# Patient Record
Sex: Female | Born: 1950
Health system: Southern US, Community
[De-identification: ages and names within clinical notes are randomized; demographics above are authoritative.]

## PROBLEM LIST (undated history)

## (undated) DIAGNOSIS — I10 Essential (primary) hypertension: Secondary | ICD-10-CM

## (undated) DIAGNOSIS — E785 Hyperlipidemia, unspecified: Secondary | ICD-10-CM

## (undated) DIAGNOSIS — T783XXA Angioneurotic edema, initial encounter: Secondary | ICD-10-CM

## (undated) DIAGNOSIS — E669 Obesity, unspecified: Secondary | ICD-10-CM

## (undated) DIAGNOSIS — I341 Nonrheumatic mitral (valve) prolapse: Secondary | ICD-10-CM

## (undated) HISTORY — DX: Hyperlipidemia, unspecified: E78.5

## (undated) HISTORY — PX: ABDOMINAL HYSTERECTOMY: SHX81

## (undated) HISTORY — DX: Essential (primary) hypertension: I10

## (undated) HISTORY — PX: OTHER SURGICAL HISTORY: SHX169

## (undated) HISTORY — DX: Angioneurotic edema, initial encounter: T78.3XXA

## (undated) HISTORY — DX: Obesity, unspecified: E66.9

## (undated) HISTORY — DX: Nonrheumatic mitral (valve) prolapse: I34.1

---

## 1997-12-03 ENCOUNTER — Other Ambulatory Visit: Admission: RE | Admit: 1997-12-03 | Discharge: 1997-12-03 | Payer: Self-pay | Admitting: Gynecology

## 1998-12-16 ENCOUNTER — Other Ambulatory Visit: Admission: RE | Admit: 1998-12-16 | Discharge: 1998-12-16 | Payer: Self-pay | Admitting: Gynecology

## 2000-02-08 ENCOUNTER — Other Ambulatory Visit: Admission: RE | Admit: 2000-02-08 | Discharge: 2000-02-08 | Payer: Self-pay | Admitting: Gynecology

## 2000-05-03 ENCOUNTER — Encounter (INDEPENDENT_AMBULATORY_CARE_PROVIDER_SITE_OTHER): Payer: Self-pay | Admitting: Specialist

## 2000-05-03 ENCOUNTER — Other Ambulatory Visit: Admission: RE | Admit: 2000-05-03 | Discharge: 2000-05-03 | Payer: Self-pay | Admitting: Gynecology

## 2000-05-24 ENCOUNTER — Encounter: Payer: Self-pay | Admitting: Gynecology

## 2000-05-31 ENCOUNTER — Inpatient Hospital Stay (HOSPITAL_COMMUNITY): Admission: RE | Admit: 2000-05-31 | Discharge: 2000-06-01 | Payer: Self-pay | Admitting: Gynecology

## 2000-05-31 ENCOUNTER — Encounter (INDEPENDENT_AMBULATORY_CARE_PROVIDER_SITE_OTHER): Payer: Self-pay | Admitting: Specialist

## 2001-02-28 ENCOUNTER — Other Ambulatory Visit: Admission: RE | Admit: 2001-02-28 | Discharge: 2001-02-28 | Payer: Self-pay | Admitting: Gynecology

## 2002-03-11 ENCOUNTER — Other Ambulatory Visit: Admission: RE | Admit: 2002-03-11 | Discharge: 2002-03-11 | Payer: Self-pay | Admitting: Gynecology

## 2003-03-12 ENCOUNTER — Other Ambulatory Visit: Admission: RE | Admit: 2003-03-12 | Discharge: 2003-03-12 | Payer: Self-pay | Admitting: Gynecology

## 2003-05-19 ENCOUNTER — Ambulatory Visit (HOSPITAL_COMMUNITY): Admission: RE | Admit: 2003-05-19 | Discharge: 2003-05-20 | Payer: Self-pay | Admitting: Neurosurgery

## 2004-01-27 ENCOUNTER — Ambulatory Visit (HOSPITAL_COMMUNITY): Admission: RE | Admit: 2004-01-27 | Discharge: 2004-01-27 | Payer: Self-pay | Admitting: *Deleted

## 2004-03-14 ENCOUNTER — Other Ambulatory Visit: Admission: RE | Admit: 2004-03-14 | Discharge: 2004-03-14 | Payer: Self-pay | Admitting: Gynecology

## 2005-01-03 ENCOUNTER — Encounter: Admission: RE | Admit: 2005-01-03 | Discharge: 2005-01-03 | Payer: Self-pay | Admitting: Cardiology

## 2005-03-15 ENCOUNTER — Other Ambulatory Visit: Admission: RE | Admit: 2005-03-15 | Discharge: 2005-03-15 | Payer: Self-pay | Admitting: Gynecology

## 2006-03-20 ENCOUNTER — Other Ambulatory Visit: Admission: RE | Admit: 2006-03-20 | Discharge: 2006-03-20 | Payer: Self-pay | Admitting: Gynecology

## 2007-03-28 ENCOUNTER — Other Ambulatory Visit: Admission: RE | Admit: 2007-03-28 | Discharge: 2007-03-28 | Payer: Self-pay | Admitting: Gynecology

## 2008-01-20 IMAGING — US MAMMO-RUNI-US
1 series · 13 of 15 positions shown · non-contrast
Comparison: NONE

CLINICAL DATA: Naneishka Cianuro RT(R)(M)(CT)   
Diagnostic  Mammogram. 

RIGHT BREAST MAMMOGRAM ADDITIONAL VIEWS AND RIGHT BREAST 
ULTRASOUND

[Series 1: us breast · 0.08mm/px · 13 of 15 slices shown]
[im 1/15]
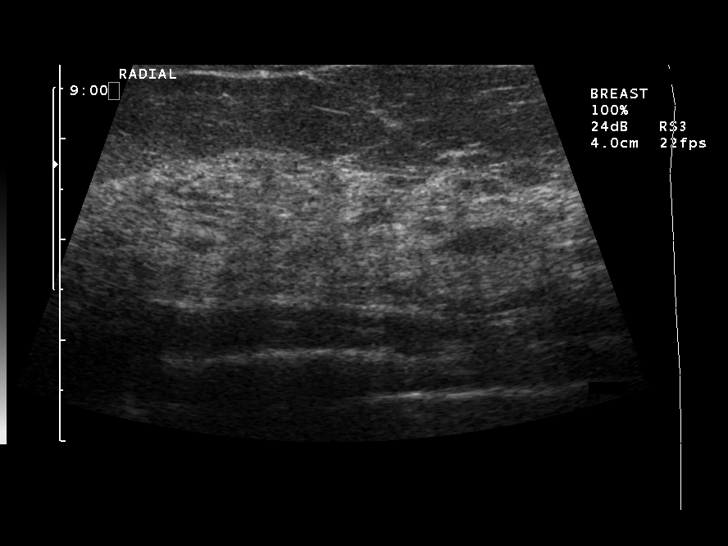
[im 2/15]
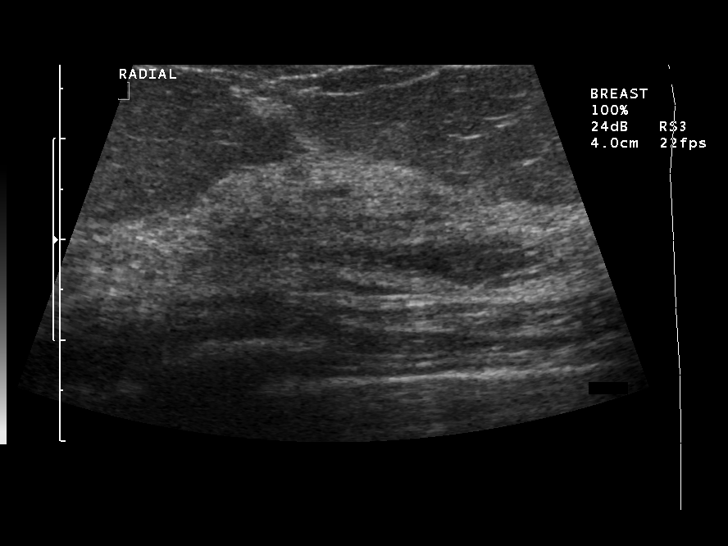
[im 3/15]
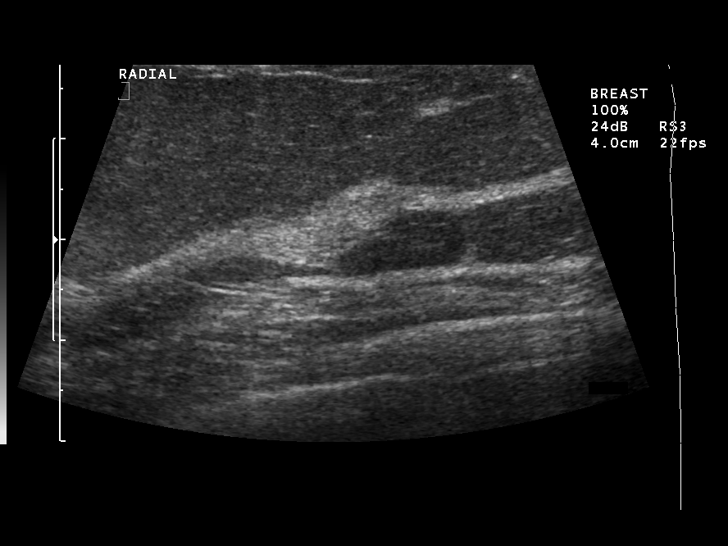
[im 5/15]
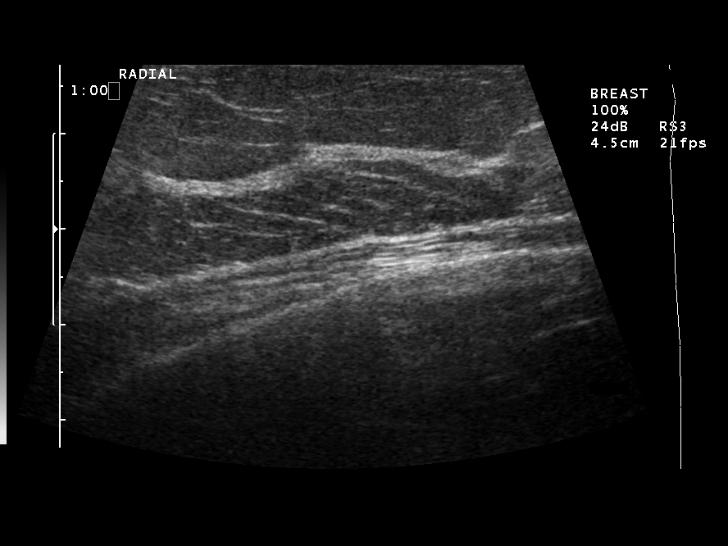
[im 6/15]
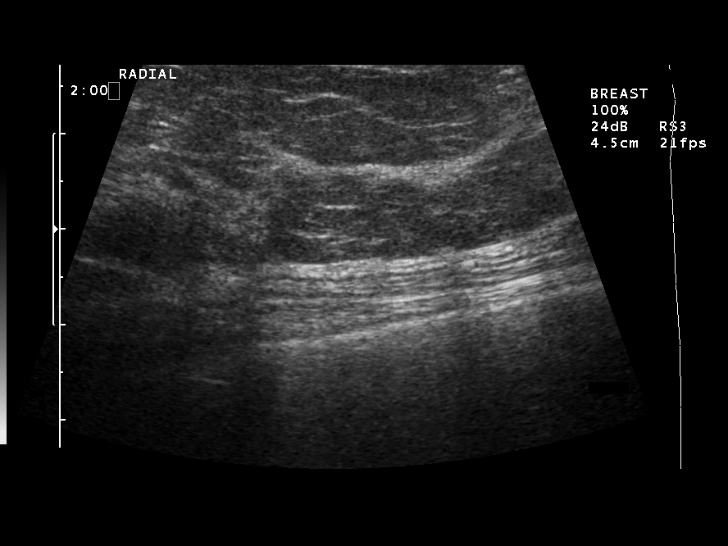
[im 7/15]
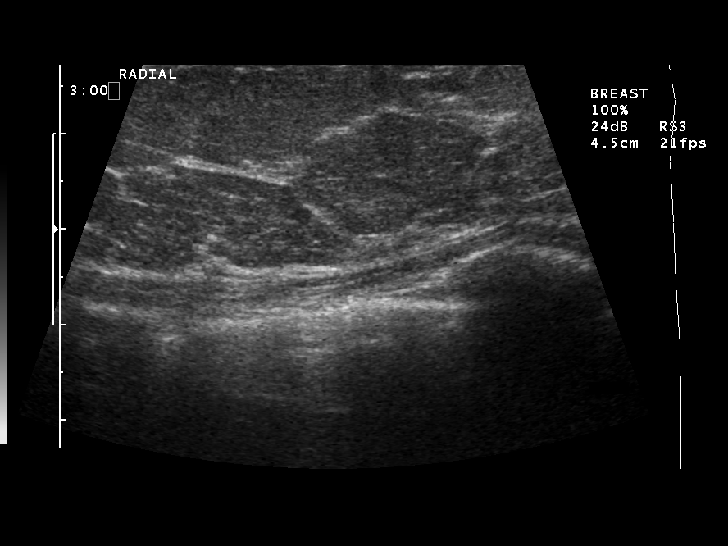
[im 8/15]
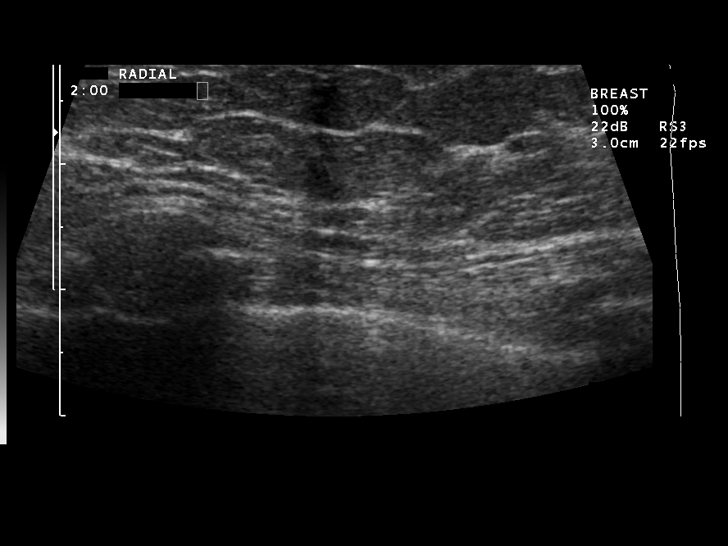
[im 9/15]
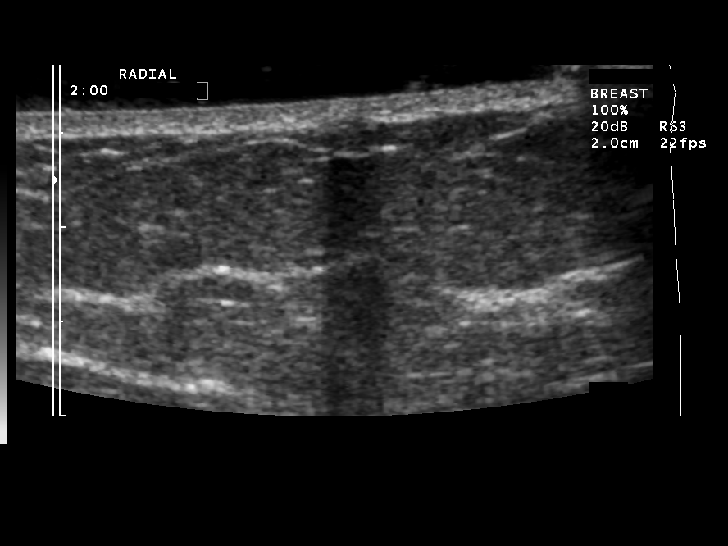
[im 10/15]
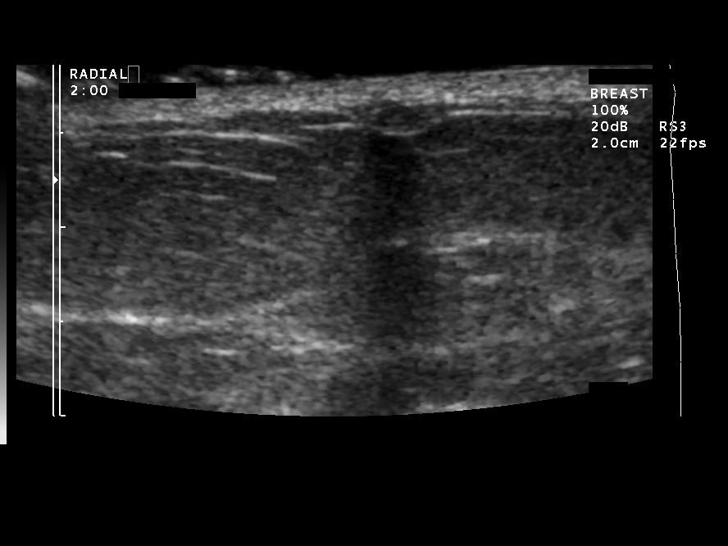
[im 11/15]
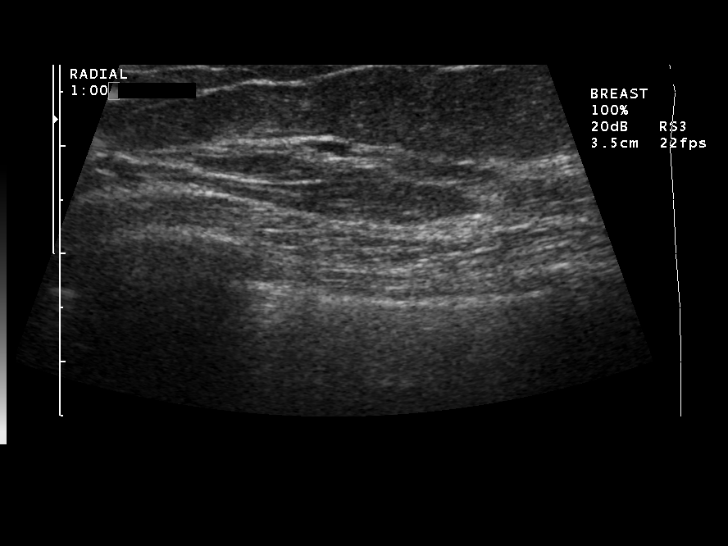
[im 13/15]
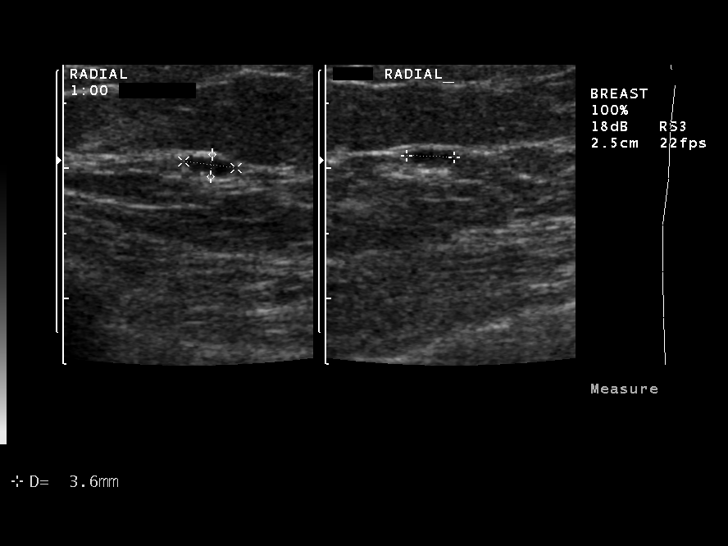
[im 14/15]
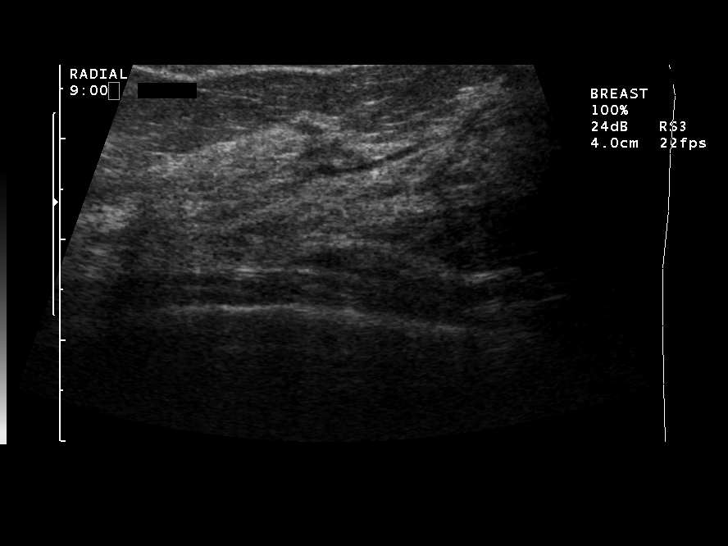
[im 15/15]
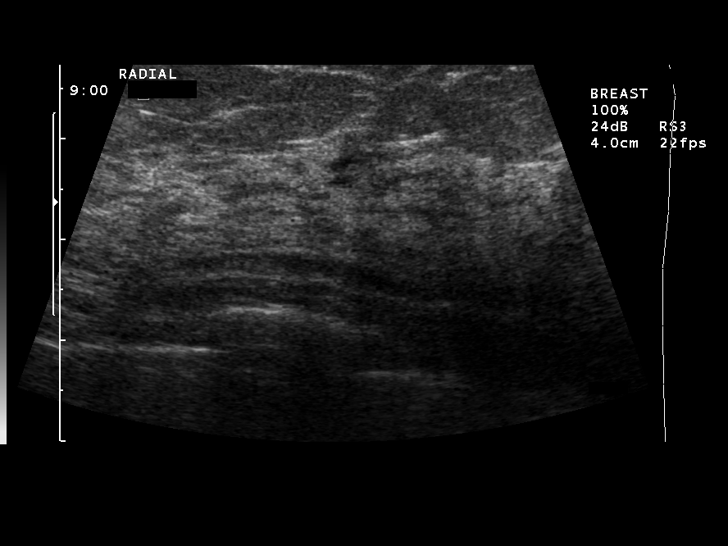

[13 of 15 positions shown; findings below may reference images not displayed]

FINDINGS: There is moderately dense breast parenchyma.  Spot 
compression view is obtained in the upper half of the right 
breast.  Previously suggested architectural distortion is no 
longer seen.  A repeat MLO view was also obtained.  There is no 
evidence of architectural distortion on the repeat view.  An 
exaggerated lateral CC view was obtained.  There is no evidence of 
architectural distortion.  A CC view was obtained.  There is no 
evidence of architectural distortion seen in this area.  There is 
prior mammogram dated 05-08-05 and 05-05-04 for comparison. 
Ultrasound is performed of the upper half of the right breast.  
There are no dominant solid masses seen.  There is a small cyst 
seen at the [DATE] position, 10 cm from the nipple that measures 4 x 
4 x 2 mm.  There is evidence of shadowing that appears to be 
associated with the skin but no mass was visualized.  There is no 
abnormal shadowing seen within the breast tissue.
IMPRESSION: No mammographic or ultrasonographic evidence of 
malignancy. I recommend routine yearly follow-up screening 
mammography. A negative mammogram and ultrasound should not delay 
further evaluation of any clinically suspicious lesion. The 
patient was informed at the time of the examination of the 
findings and recommendations by verbal and written lay report. 
Computer assisted (Second Look) technology was used as an aid in 
interpretation of this study. BI-RADS 2- Benign Rauni Lavander, M.D. 
Date: 05/23/2006 JH  KAKI

## 2010-06-24 NOTE — Op Note (Signed)
NAME:  Brooke Yoder, Brooke Yoder                  ACCOUNT NO.:  [id number]   MEDICAL RECORD NO.:  [id number]                   PATIENT TYPE:  OIB   LOCATION:  2899                                 FACILITY:  [facility]   PHYSICIAN:  Clydene Fake, M.D.               DATE OF BIRTH:  12/23/1950   DATE OF PROCEDURE:  05/19/2003  DATE OF DISCHARGE:                                 OPERATIVE REPORT   PREOPERATIVE DIAGNOSIS:  Herniated nucleus pulposus, right C6-7.   POSTOPERATIVE DIAGNOSIS:  Herniated nucleus pulposus, right C6-7.   OPERATION PERFORMED:  Anterior cervical decompression, diskectomy and fusion  at C6-7 with LifeNet allograft bone and tether anterior cervical plate.   SURGEON:  Clydene Fake, M.D.   ASSISTANT:  Hilda Lias, M.D.   ANESTHESIA:  General endotracheal tube.   ESTIMATED BLOOD LOSS:  Minimal.   BLOOD REPLACED:  None.   DRAINS:  None.   COMPLICATIONS:  None.   INDICATIONS FOR PROCEDURE:  The patient is a 60 year old woman who has been  having neck and right arm pain, numbness and weakness.  MRI shows right-  sided disk herniation at C6-7 compressing the C7 root.  She does have  decreased triceps strength, atrophy of that muscle, decreased sensation of  the C7 distribution on exam.  Patient brought in for decompression and  fusion.   DESCRIPTION OF PROCEDURE:  The patient was brought to the operating room and  general anesthesia induced.  Patient was placed in 10 pounds of halter  traction, prepped and draped in sterile fashion.  Site of incision was  injected with 10 mL of 1% lidocaine with epinephrine.  Incision was then  made from the midline to the anterior border of the sternocleidomastoid  muscle on the left side and that incision taken down to the platysma and  hemostasis obtained with Bovie cautery.  The Bovie was used to incise the  platysma and blunt dissection was taken through the anterior cervical fascia  of the anterior spine.  Needle was  placed in the interspace.  X-ray was  obtained showing this was in the C6-7 disk space.  The disk space was  incised with a 15 blade.  The needle was removed and partial diskectomy was  performed.  The longus colli muscles were reflected laterally on each side  using the Bovie and self-retaining retractor system was placed.  The disk  space was again incised with a 15 blade and diskectomy performed with  pituitary rongeurs and curets.  Microscope was brought in for  microdissection and 1 and 2 mm Kerrison punches were used to remove  posterior ligament osteophytes and then to decompress the central canal and  then perform bilateral foraminotomies.  Fragments of disk were removed from  the right foramen. When we were finished, we had good decompression of the  central canal and bilateral foramina and bilateral nerve roots were  decompressed.  Hemostasis obtained with Gelfoam and Thrombin.  This was then  removed.  High speed drill was used to remove cartilaginous end plate. We  then measured the height of the disk space.  Using a LifeNet trowel 6 mm  fit, we roughed up the end plates using the 6 mm broach and then tapped a 6  mm LifeNet allograft bone into place, countersinking it about 1 mm.  We  checked posterior to the bone graft and there was plenty of room between  dura and bone graft with a nerve hook.  Wound was irrigated with antibiotic  solution.  Prior to doing the diskectomy, distraction pins were placed in  the C6 and 7 and the interspace distracted. The distraction was removed.  Distraction pins were removed.  Hemostasis obtained with Gelfoam and  Thrombin.  The weight was removed from the traction.  Bone was still in good  position and firmly in place.  The microscope was removed from the field.  The anterior cervical plate was placed over the anterior cervical spine, two  screws placed in C6, two into C7.  These were tightened down.  Lateral x-ray  was obtained showing good  position of plate and screws and interbody bone  graft with good orientation of the spine. Wound was irrigated with  antibiotic solution.  Hemostasis was obtained with bipolar cautery, Gelfoam  and Thrombin.  Gelfoam was irrigated.  When we had good hemostasis the  platysma was closed with 3-0 Vicryl interrupted suture.  The subcutaneous  tissue was closed with the same and skin closed with benzoin and Steri-  Strips.  Dressing was placed.  Patient was placed in ____________ awakened  from anesthesia and transferred to recovery room in stable condition.                                               Clydene Fake, M.D.    JRH/MEDQ  D:  05/19/2003  T:  05/20/2003  Job:  [id number]

## 2010-06-24 NOTE — H&P (Signed)
[facility]  Patient:    Brooke Yoder, Brooke Yoder               MRN: [id number] Adm. Date:  [id number] Attending:  Teodora Medici Cabitt                         History and Physical  ADMITTING DIAGNOSIS:  Fibroid uterus.  HISTORY OF PRESENT ILLNESS:  The patient is a 60 year old gravida 2, para 2, female admitted with fibroid uterus and menorrhagia and anemia for a total abdominal hysterectomy and bilateral salpingo-oophorectomy.  The patient has longstanding menorrhagia, which has been unresponsive to oral contraceptives. Platelet count, PT, and PTT and TSH have been normal, and saline ultrasound was negative.  The patient does have a fibroid uterus.  The patients hemoglobin was 9.9 on April 24, 2000, and options were discussed with the patient, including uterine fibroid embolization and hysterectomy.  Endometrial biopsy was performed, which revealed benign secretory endometrium.  The patient has elected to proceed with hysterectomy.  The potential risks of the surgery, including but not limited to anesthesia, injury to the bowel, bladder or ureters, possible fistula formation, possible blood loss with transfusion and its sequelae, and possible infection in the wound or the pelvis, have been discussed in detail with the patient.  I also had the opportunity to discuss the patients case and management options with a physician representing the insurance company, who also concurred with the decision to proceed with hysterectomy.  Postoperative expectations and restrictions have been reviewed with the patient.  PAST MEDICAL HISTORY:  Surgical:  None.  Medical:  None.  MEDICATIONS:  None.  ALLERGIES:  None known.  SOCIAL HISTORY:  Smoking:  None.  ETOH:  Social.  The patient is married, in a supportive relationship.  FAMILY HISTORY:  Positive for cancer of the breast in a second cousin.  PHYSICAL EXAMINATION:  HEENT:  Negative.  CHEST:  The lungs are  clear.  CARDIAC:  The heart is without murmurs.  BREASTS:  Without masses or discharge.  ABDOMEN:  Soft and nontender.  PELVIC:  BUS, vagina and cervix normal.  The uterus is eight weeks in size and irregular.  The adnexa without palpable masses.  RECTAL:  Negative.  EXTREMITIES:  Negative.  IMPRESSION:  A fibroid uterus, menorrhagia, anemia.  PLAN:  Total abdominal hysterectomy and bilateral salpingo-oophorectomy. DD:  05/31/00 TD:  05/31/00 Job: [id number]

## 2010-06-24 NOTE — Op Note (Signed)
[facility]  Patient:    Brooke Yoder, Brooke Yoder               MRN: [id number] Proc. Date: 05/31/00 Adm. Date:  [id number] Disc. Date: [id number] Attending:  Teodora Medici Cabitt                           Operative Report  PREOPERATIVE DIAGNOSIS:  Fibroid uterus, menorrhagia, and anemia.  POSTOPERATIVE DIAGNOSIS:  Fibroid uterus, menorrhagia, and anemia.  OPERATION PERFORMED:  Total abdominal hysterectomy and bilateral salpingo-oophorectomy.  SURGEON:  Leatha Gilding. Mezer, M.D.  ASSISTANT:  Harl Bowie, M.D.  ANESTHESIA:  Endotracheal.  PREPARATION:  Betadine.  PROCEDURE:  With the patient in the supine position, she was prepped and draped in the routine fashion. A Pfannenstiel incision was made through the skin and subcutaneous tissue.  The fascia and peritoneum were opened without difficulty.  Upon entering the peritoneal cavity, it was noted that the retractor could not be fully opened on the left side, secondary to adhesions of the sigmoid colon to left round ligament and infundibulopelvic ligament. The retractor was opened partially, the bowel packed back, and these adhesions taken down in multiple layers.  The adhesions were quite dense and were very close to the main vasculature of the infundibulopelvic ligament.  These adhesions were very carefully taken down and attention paid to hemostasis. After the operative site was restored, the upper abdomen was briefly examined and found to be grossly normal.  The uterus was approximately eight weeks in size and irregular with a prominent subserosal leiomyoma.  The left tube and ovary were normal.  The right ovary contained what appeared to be a corpus luteum cyst.  The round ligaments were suture ligated with #1 chromic and divided.  The anterior leaf of the broad ligament was opened bilaterally and the bladder flap taken down sharply and bluntly without difficulty.  The infundibulopelvic ligaments  were isolated, clamped, cut, and free tied with #1 chromic and then suture ligated with #1 chromic.  The uterine arteries were clamped, cut, and suture ligated with #1 chromic.  The cardinal ligaments were taken in several bites, clamped, cut, and suture ligated with #1 chromic. There was a significant amount of back bleeding throughout the entire dissection.  The uterosacral ligaments were taken separately, clamped, cut, and suture ligated with #1 chromic.  The vagina was entered laterally on the left and the specimen excised with circumferential dissection.  The cervix was intact.  The angles were closed with ______ type angle sutures of #1 chromic, and the cuff was whipped anteriorly and posteriorly with running locked #1 chromic suture.  Hemostasis was assured at the cuff and the base of the bladder, and two posterior-anterior stitches of #1 chromic were placed to decrease the vaginal opening. The bladder was then placed over the vaginal cuff with a running 3-0 Vicryl suture.  At the completion of the procedure, the pedicles were inspected.  Hemostasis was noted to be intact.  The ureters were inspected and found to be out of harms way.  The adhesions that had been previously lysed were inspected.  One small bleeding area was noted, which was cauterized.  At the completion of the procedure with hemostasis intact, an effort was made to place the large bowel in the cul-de-sac.  The omentum was brought down.  The abdomen was closed in layers using a running 2-0 Vicryl on the peritoneum and running 0 Vicryl to the  midline bilaterally on the fascia. Hemostasis was secured in the subcutaneous tissue, and the skin was closed with staples.  The estimated blood loss was 350 cc.  The sponge, instrument, and needle counts were correct x2.  The patient tolerated the procedure well and was taken to the recovery room in satisfactory condition. DD:  05/31/00 TD:  06/01/00 Job: [id number]

## 2010-07-12 ENCOUNTER — Encounter: Payer: Self-pay | Admitting: Cardiology

## 2010-07-12 ENCOUNTER — Other Ambulatory Visit: Payer: Self-pay | Admitting: *Deleted

## 2010-07-12 DIAGNOSIS — E785 Hyperlipidemia, unspecified: Secondary | ICD-10-CM

## 2010-07-13 ENCOUNTER — Encounter: Payer: Self-pay | Admitting: Cardiology

## 2010-07-13 ENCOUNTER — Ambulatory Visit (INDEPENDENT_AMBULATORY_CARE_PROVIDER_SITE_OTHER): Payer: 59 | Admitting: Cardiology

## 2010-07-13 ENCOUNTER — Other Ambulatory Visit (INDEPENDENT_AMBULATORY_CARE_PROVIDER_SITE_OTHER): Payer: 59 | Admitting: *Deleted

## 2010-07-13 DIAGNOSIS — Z8249 Family history of ischemic heart disease and other diseases of the circulatory system: Secondary | ICD-10-CM

## 2010-07-13 DIAGNOSIS — I10 Essential (primary) hypertension: Secondary | ICD-10-CM

## 2010-07-13 DIAGNOSIS — E785 Hyperlipidemia, unspecified: Secondary | ICD-10-CM

## 2010-07-13 DIAGNOSIS — E7800 Pure hypercholesterolemia, unspecified: Secondary | ICD-10-CM | POA: Insufficient documentation

## 2010-07-13 LAB — HEPATIC FUNCTION PANEL
ALT: 26 U/L (ref 0–35)
AST: 28 U/L (ref 0–37)
Albumin: 4.3 g/dL (ref 3.5–5.2)
Alkaline Phosphatase: 96 U/L (ref 39–117)
Bilirubin, Direct: 0.1 mg/dL (ref 0.0–0.3)
Total Bilirubin: 0.4 mg/dL (ref 0.3–1.2)
Total Protein: 7.9 g/dL (ref 6.0–8.3)

## 2010-07-13 LAB — BASIC METABOLIC PANEL
BUN: 18 mg/dL (ref 6–23)
CO2: 28 mEq/L (ref 19–32)
Calcium: 9.9 mg/dL (ref 8.4–10.5)
Chloride: 103 mEq/L (ref 96–112)
Creatinine, Ser: 0.8 mg/dL (ref 0.4–1.2)
GFR: 80.33 mL/min (ref >60.00)
Glucose, Bld: 110 mg/dL — ABNORMAL HIGH (ref 70–99)
Potassium: 4.1 mEq/L (ref 3.5–5.1)
Sodium: 139 mEq/L (ref 135–145)

## 2010-07-13 LAB — LIPID PANEL
Cholesterol: 189 mg/dL (ref 0–200)
HDL: 82 mg/dL (ref >39.00)
LDL Cholesterol: 86 mg/dL (ref 0–99)
Total CHOL/HDL Ratio: 2
Triglycerides: 103 mg/dL (ref 0.0–149.0)
VLDL: 20.6 mg/dL (ref 0.0–40.0)

## 2010-07-13 NOTE — Assessment & Plan Note (Signed)
Lab work is pending today 

## 2010-07-13 NOTE — Assessment & Plan Note (Signed)
We'll continue current medicines. I am aware  that amlodipine causes her mild edema.I think her blood pressure will improve with weight loss. She's intolerant of ACE inhibitors because of angioedema and hopefully she can accomplish most of this adequate blood pressure control with weight loss.

## 2010-07-13 NOTE — Progress Notes (Signed)
Subjective:   Brooke Yoder is seen today for followup of her hypertension, hyperlipemia, and mild obesity. She's complaining of some mild edema with amlodipine that she's intolerant of ACE inhibitors because of angioedema. She's currently doing Weight Watchers and trying to lose weight again but just started approximately one week ago. Her blood pressure slightly elevated today but has been satisfactory.  Current Outpatient Prescriptions  Medication Sig Dispense Refill  . amLODipine (NORVASC) 5 MG tablet Take 5 mg by mouth daily.        Marland Kitchen aspirin 81 MG tablet Take 81 mg by mouth daily.        . Calcium Carbonate (CALTRATE 600 PO) Take by mouth 2 (two) times daily.        . Cetirizine HCl (ZYRTEC PO) Take by mouth as needed.        . fish oil-omega-3 fatty acids 1000 MG capsule Take by mouth daily.        . hydrochlorothiazide 25 MG tablet Take 25 mg by mouth daily.        . metoprolol (TOPROL-XL) 50 MG 24 hr tablet Take 50 mg by mouth daily.        . Multiple Vitamin (MULTIVITAMIN) tablet Take 1 tablet by mouth daily.        . rosuvastatin (CRESTOR) 5 MG tablet Take 5 mg by mouth daily.        Marland Kitchen DISCONTD: estrogens conjugated, synthetic A, (CENESTIN) 0.3 MG tablet Take 0.3 mg by mouth daily.          Allergies  Allergen Reactions  . Lisinopril   . Percocet (Oxycodone-Acetaminophen)     There is no problem list on file for this patient.   History  Smoking status  . Former Smoker  . Quit date: 02/07/1984  Smokeless tobacco  . Not on file    History  Alcohol Use No    Family History  Problem Relation Age of Onset  . Heart attack Father   . Hypertension Father   . Arthritis Mother   . Hypertension Sister   . Hypertension Sister     Review of Systems:   The patient denies any heat or cold intolerance.  No weight gain or weight loss.  The patient denies headaches or blurry vision.  There is no cough or sputum production.  The patient denies dizziness.  There is no hematuria or  hematochezia.  The patient denies any muscle aches or arthritis.  The patient denies any rash.  The patient denies frequent falling or instability.  There is no history of depression or anxiety.  All other systems were reviewed and are negative.   Physical Exam:   Weight is 118. Blood pressure is 152/70 sitting heart rate 72.The head is normocephalic and atraumatic.  Pupils are equally round and reactive to light.  Sclerae nonicteric.  Conjunctiva is clear.  Oropharynx is unremarkable.  There's adequate oral airway.  Neck is supple there are no masses.  Thyroid is not enlarged.  There is no lymphadenopathy.  Lungs are clear.  Chest is symmetric.  Heart shows a regular rate and rhythm.  S1 and S2 are normal.  There is no murmur click or gallop.  Abdomen is soft normal bowel sounds.  There is no organomegaly.  Genital and rectal deferred.  Extremities are without edema.  Peripheral pulses are adequate.  Neurologically intact.  Full range of motion.  The patient is not depressed.  Skin is warm and dry.  Assessment / Plan:

## 2010-07-18 NOTE — Progress Notes (Signed)
Patient called with lab results. Pt verbalized understanding. Jodette Ronald Londo RN  

## 2010-11-15 ENCOUNTER — Other Ambulatory Visit: Payer: Self-pay | Admitting: Nurse Practitioner

## 2010-11-29 ENCOUNTER — Other Ambulatory Visit: Payer: Self-pay | Admitting: Nurse Practitioner

## 2010-11-29 NOTE — Telephone Encounter (Addendum)
Pt calling re rx for crestor, pharmacy said we  called in for 90 days, 65 pills, instead, of 90 days and 90 pills, pls call

## 2010-11-29 NOTE — Telephone Encounter (Signed)
CALLED BECAUSE MAIL ORDER CHARGED HER EXTRA MONEY FOR 65 PILLS, WHEN WE PRESCRIBED IT FOR 90 DAY SUPPLY. SHE WANTED TO VERIFY THAT WE PRESCRIBED IT FOR 90 DAY SUPPLY.

## 2015-05-17 ENCOUNTER — Other Ambulatory Visit: Payer: Self-pay | Admitting: Obstetrics & Gynecology

## 2015-05-17 DIAGNOSIS — R922 Inconclusive mammogram: Secondary | ICD-10-CM

## 2015-05-20 ENCOUNTER — Other Ambulatory Visit: Payer: Self-pay

## 2015-05-24 ENCOUNTER — Ambulatory Visit
Admission: RE | Admit: 2015-05-24 | Discharge: 2015-05-24 | Disposition: A | Discharge status: Home / Self Care | Payer: BLUE CROSS/BLUE SHIELD | Source: Ambulatory Visit | Attending: Obstetrics & Gynecology | Admitting: Obstetrics & Gynecology

## 2015-05-24 DIAGNOSIS — R922 Inconclusive mammogram: Secondary | ICD-10-CM

## 2016-06-21 DIAGNOSIS — T63441A Toxic effect of venom of bees, accidental (unintentional), initial encounter: Secondary | ICD-10-CM | POA: Diagnosis not present

## 2016-06-21 DIAGNOSIS — L7211 Pilar cyst: Secondary | ICD-10-CM | POA: Diagnosis not present

## 2016-06-21 DIAGNOSIS — L509 Urticaria, unspecified: Secondary | ICD-10-CM | POA: Diagnosis not present

## 2016-07-19 DIAGNOSIS — M25561 Pain in right knee: Secondary | ICD-10-CM | POA: Diagnosis not present

## 2016-07-20 DIAGNOSIS — L7211 Pilar cyst: Secondary | ICD-10-CM | POA: Diagnosis not present

## 2016-07-20 DIAGNOSIS — L720 Epidermal cyst: Secondary | ICD-10-CM | POA: Diagnosis not present

## 2016-07-21 DIAGNOSIS — Z124 Encounter for screening for malignant neoplasm of cervix: Secondary | ICD-10-CM | POA: Diagnosis not present

## 2016-07-21 DIAGNOSIS — Z6826 Body mass index (BMI) 26.0-26.9, adult: Secondary | ICD-10-CM | POA: Diagnosis not present

## 2016-07-31 DIAGNOSIS — Z4802 Encounter for removal of sutures: Secondary | ICD-10-CM | POA: Diagnosis not present

## 2016-08-14 DIAGNOSIS — E785 Hyperlipidemia, unspecified: Secondary | ICD-10-CM | POA: Diagnosis not present

## 2016-08-14 DIAGNOSIS — I10 Essential (primary) hypertension: Secondary | ICD-10-CM | POA: Diagnosis not present

## 2016-08-14 DIAGNOSIS — E039 Hypothyroidism, unspecified: Secondary | ICD-10-CM | POA: Diagnosis not present

## 2016-08-16 DIAGNOSIS — Z23 Encounter for immunization: Secondary | ICD-10-CM | POA: Diagnosis not present

## 2016-08-16 DIAGNOSIS — Z0000 Encounter for general adult medical examination without abnormal findings: Secondary | ICD-10-CM | POA: Diagnosis not present

## 2016-08-16 DIAGNOSIS — I10 Essential (primary) hypertension: Secondary | ICD-10-CM | POA: Diagnosis not present

## 2016-08-16 DIAGNOSIS — M25561 Pain in right knee: Secondary | ICD-10-CM | POA: Diagnosis not present

## 2016-08-29 DIAGNOSIS — M25561 Pain in right knee: Secondary | ICD-10-CM | POA: Diagnosis not present

## 2016-08-31 DIAGNOSIS — M25561 Pain in right knee: Secondary | ICD-10-CM | POA: Diagnosis not present

## 2016-09-26 DIAGNOSIS — Z1231 Encounter for screening mammogram for malignant neoplasm of breast: Secondary | ICD-10-CM | POA: Diagnosis not present

## 2016-09-28 DIAGNOSIS — Y999 Unspecified external cause status: Secondary | ICD-10-CM | POA: Diagnosis not present

## 2016-09-28 DIAGNOSIS — M2241 Chondromalacia patellae, right knee: Secondary | ICD-10-CM | POA: Diagnosis not present

## 2016-09-28 DIAGNOSIS — S83231A Complex tear of medial meniscus, current injury, right knee, initial encounter: Secondary | ICD-10-CM | POA: Diagnosis not present

## 2016-09-28 DIAGNOSIS — G8918 Other acute postprocedural pain: Secondary | ICD-10-CM | POA: Diagnosis not present

## 2016-10-05 DIAGNOSIS — H524 Presbyopia: Secondary | ICD-10-CM | POA: Diagnosis not present

## 2016-10-05 DIAGNOSIS — H2513 Age-related nuclear cataract, bilateral: Secondary | ICD-10-CM | POA: Diagnosis not present

## 2016-10-06 DIAGNOSIS — M25561 Pain in right knee: Secondary | ICD-10-CM | POA: Diagnosis not present

## 2016-10-06 DIAGNOSIS — S83231D Complex tear of medial meniscus, current injury, right knee, subsequent encounter: Secondary | ICD-10-CM | POA: Diagnosis not present

## 2016-10-06 DIAGNOSIS — Z9889 Other specified postprocedural states: Secondary | ICD-10-CM | POA: Diagnosis not present

## 2016-10-06 DIAGNOSIS — M2241 Chondromalacia patellae, right knee: Secondary | ICD-10-CM | POA: Diagnosis not present

## 2016-10-12 DIAGNOSIS — S83231D Complex tear of medial meniscus, current injury, right knee, subsequent encounter: Secondary | ICD-10-CM | POA: Diagnosis not present

## 2016-10-12 DIAGNOSIS — M25561 Pain in right knee: Secondary | ICD-10-CM | POA: Diagnosis not present

## 2016-10-12 DIAGNOSIS — M2241 Chondromalacia patellae, right knee: Secondary | ICD-10-CM | POA: Diagnosis not present

## 2016-10-17 DIAGNOSIS — S83231D Complex tear of medial meniscus, current injury, right knee, subsequent encounter: Secondary | ICD-10-CM | POA: Diagnosis not present

## 2016-10-17 DIAGNOSIS — M25561 Pain in right knee: Secondary | ICD-10-CM | POA: Diagnosis not present

## 2016-10-17 DIAGNOSIS — M2241 Chondromalacia patellae, right knee: Secondary | ICD-10-CM | POA: Diagnosis not present

## 2016-10-19 DIAGNOSIS — M2241 Chondromalacia patellae, right knee: Secondary | ICD-10-CM | POA: Diagnosis not present

## 2016-10-19 DIAGNOSIS — S83231D Complex tear of medial meniscus, current injury, right knee, subsequent encounter: Secondary | ICD-10-CM | POA: Diagnosis not present

## 2016-10-19 DIAGNOSIS — M25561 Pain in right knee: Secondary | ICD-10-CM | POA: Diagnosis not present

## 2016-10-23 DIAGNOSIS — M25561 Pain in right knee: Secondary | ICD-10-CM | POA: Diagnosis not present

## 2016-10-23 DIAGNOSIS — S83231D Complex tear of medial meniscus, current injury, right knee, subsequent encounter: Secondary | ICD-10-CM | POA: Diagnosis not present

## 2016-10-23 DIAGNOSIS — M2241 Chondromalacia patellae, right knee: Secondary | ICD-10-CM | POA: Diagnosis not present

## 2016-10-25 DIAGNOSIS — M25561 Pain in right knee: Secondary | ICD-10-CM | POA: Diagnosis not present

## 2016-10-25 DIAGNOSIS — S83231D Complex tear of medial meniscus, current injury, right knee, subsequent encounter: Secondary | ICD-10-CM | POA: Diagnosis not present

## 2016-10-25 DIAGNOSIS — M2241 Chondromalacia patellae, right knee: Secondary | ICD-10-CM | POA: Diagnosis not present

## 2016-11-04 DIAGNOSIS — Z23 Encounter for immunization: Secondary | ICD-10-CM | POA: Diagnosis not present

## 2016-11-29 DIAGNOSIS — Z9889 Other specified postprocedural states: Secondary | ICD-10-CM | POA: Diagnosis not present

## 2016-12-13 ENCOUNTER — Other Ambulatory Visit (HOSPITAL_COMMUNITY): Payer: Self-pay | Admitting: Orthopaedic Surgery

## 2016-12-13 DIAGNOSIS — M7989 Other specified soft tissue disorders: Principal | ICD-10-CM

## 2016-12-13 DIAGNOSIS — Z9889 Other specified postprocedural states: Secondary | ICD-10-CM | POA: Diagnosis not present

## 2016-12-13 DIAGNOSIS — M79604 Pain in right leg: Secondary | ICD-10-CM

## 2016-12-14 ENCOUNTER — Ambulatory Visit (HOSPITAL_COMMUNITY)
Admission: RE | Admit: 2016-12-14 | Discharge: 2016-12-14 | Disposition: A | Discharge status: Home / Self Care | Payer: Medicare Other | Source: Ambulatory Visit | Attending: Orthopaedic Surgery | Admitting: Orthopaedic Surgery

## 2016-12-14 DIAGNOSIS — M7989 Other specified soft tissue disorders: Secondary | ICD-10-CM

## 2016-12-14 DIAGNOSIS — M79604 Pain in right leg: Secondary | ICD-10-CM | POA: Diagnosis not present

## 2016-12-14 DIAGNOSIS — M25561 Pain in right knee: Secondary | ICD-10-CM | POA: Insufficient documentation

## 2016-12-14 NOTE — Progress Notes (Signed)
Right lower extremity venous duplex has been completed. Negative for DVT. Results were given to [facility] at Dr. Erin Sons office.  12/14/16 9:13 AM Carlos Levering RVT

## 2016-12-27 DIAGNOSIS — M722 Plantar fascial fibromatosis: Secondary | ICD-10-CM | POA: Diagnosis not present

## 2017-01-21 IMAGING — US US BREAST 10
1 series · 6 of 6 positions shown · non-contrast
Comparison: Previous exam(s).

CLINICAL DATA: Palpable lump lower inner quadrant left breast.
Patient states that she believes the palpable area may be a
sebaceous cyst. She has had several sebaceous cysts on different
parts of the body in the past.

EXAM:
DIGITAL DIAGNOSTIC LEFT MAMMOGRAM WITH CAD
ULTRASOUND LEFT BREAST

[Series 1: us breast 10 · 0.05mm/px · 6 of 6 slices shown]
[im 1/6]
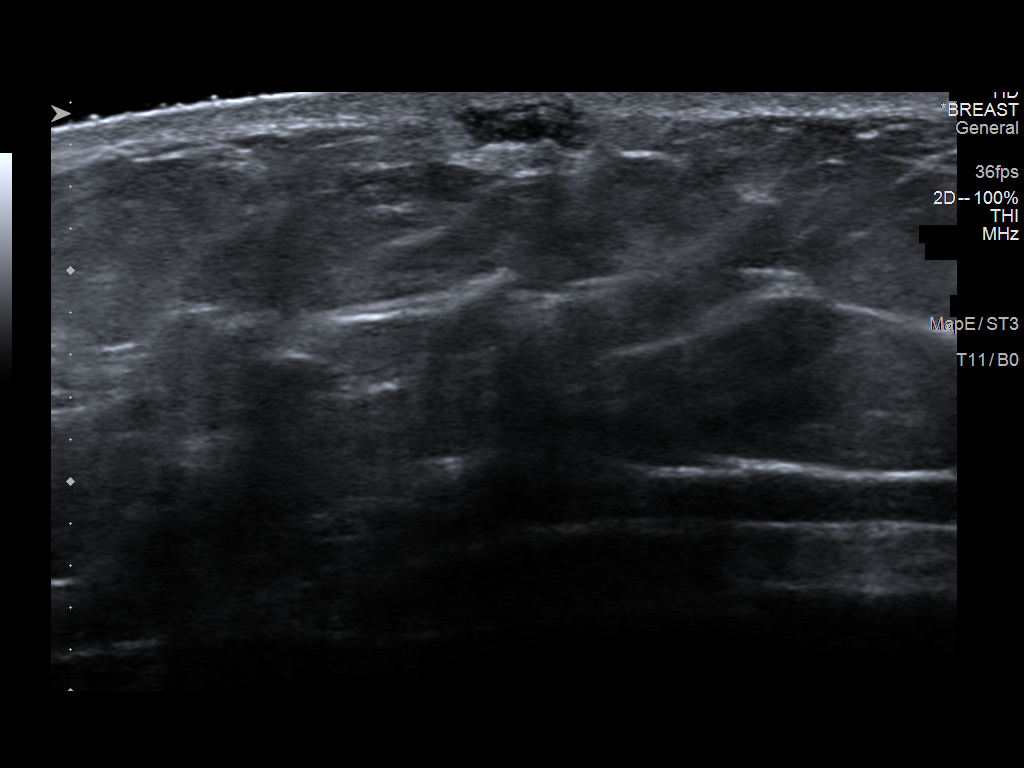
[im 2/6]
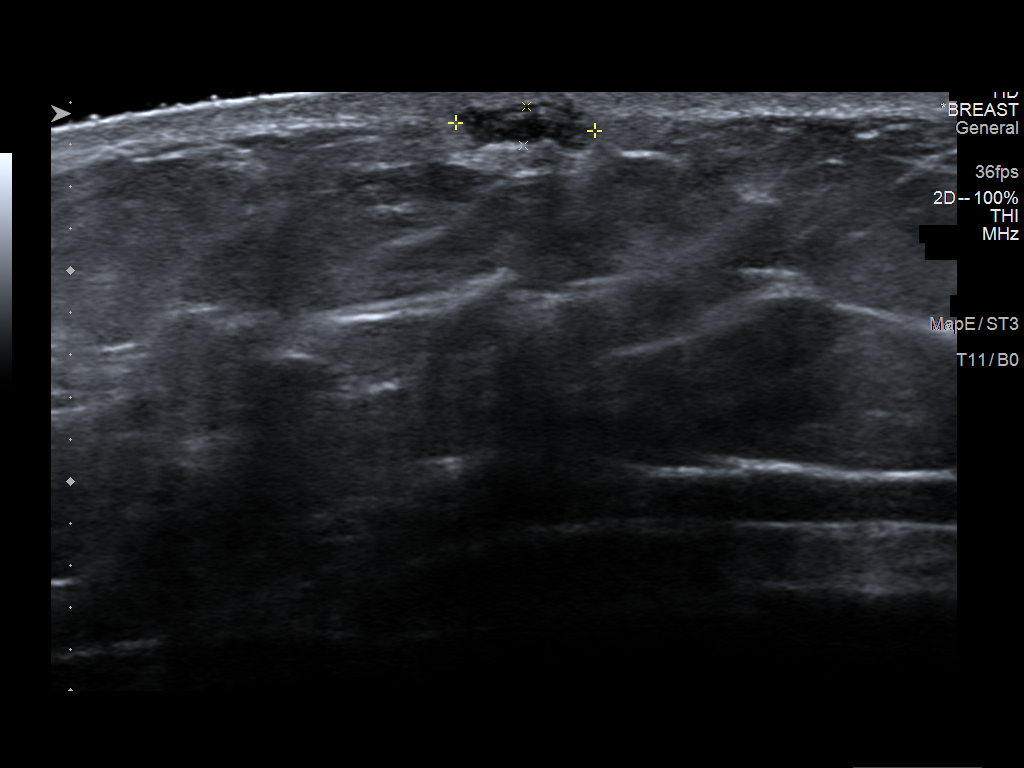
[im 3/6]
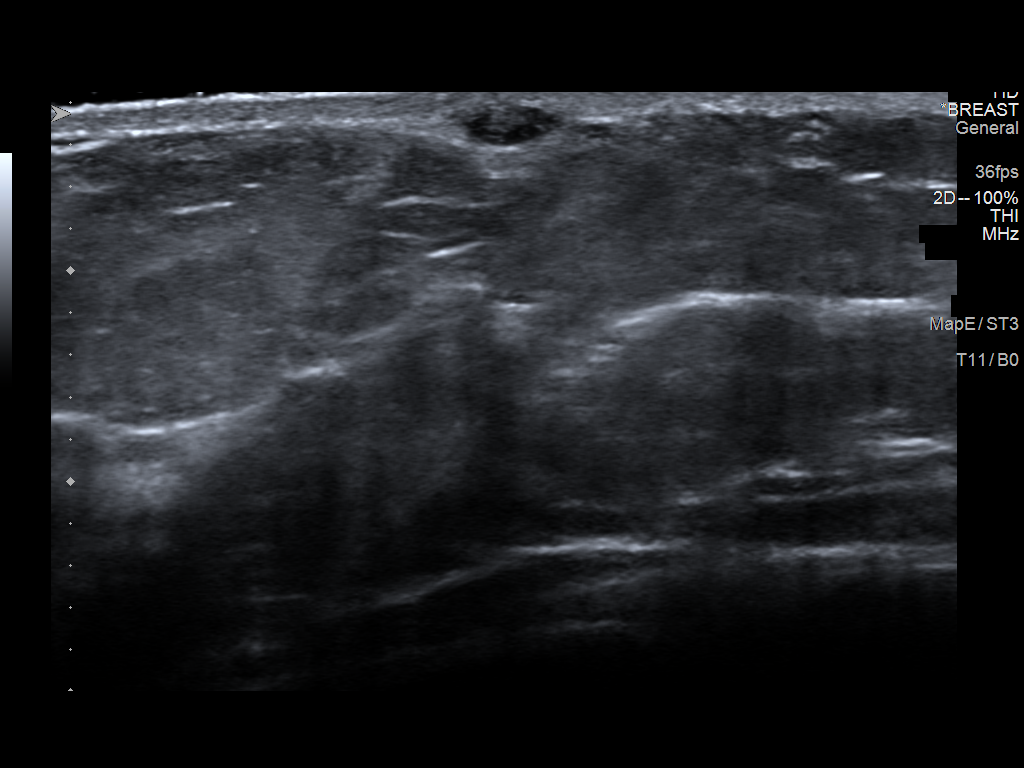
[im 4/6]
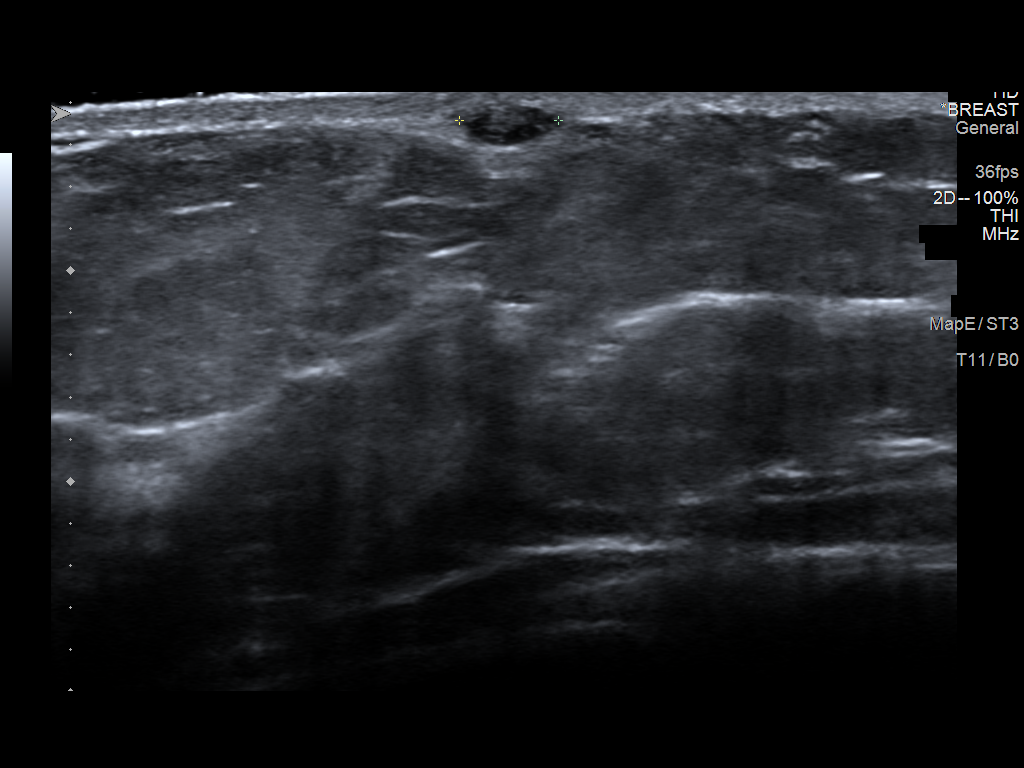
[im 5/6]
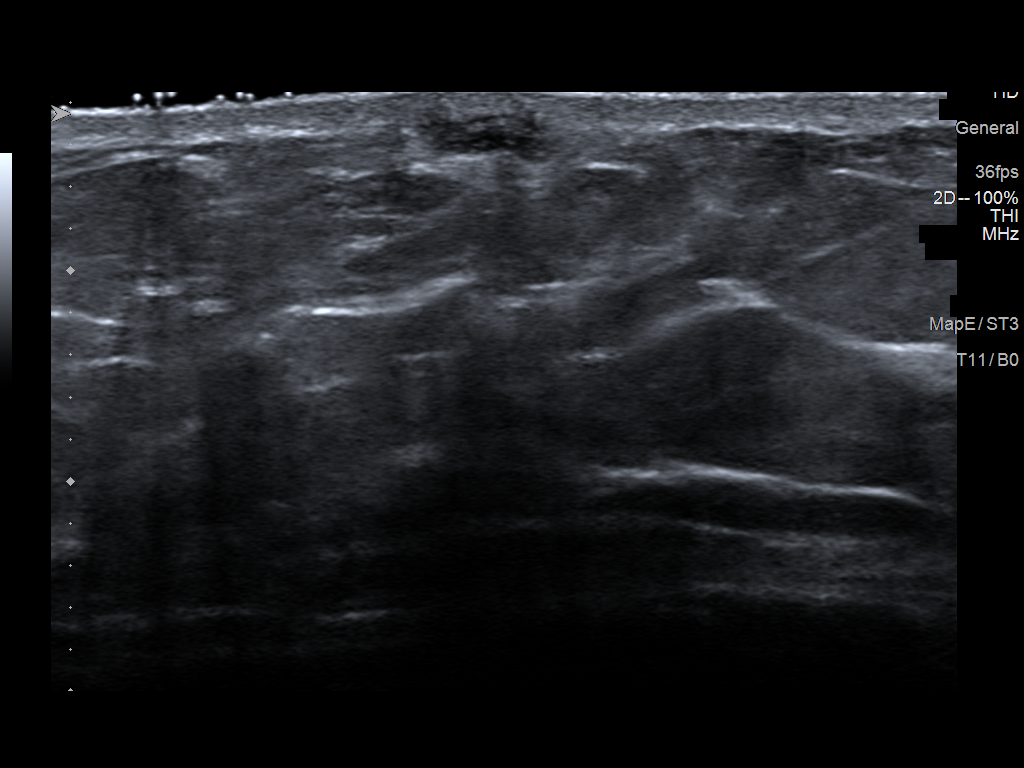
[im 6/6]
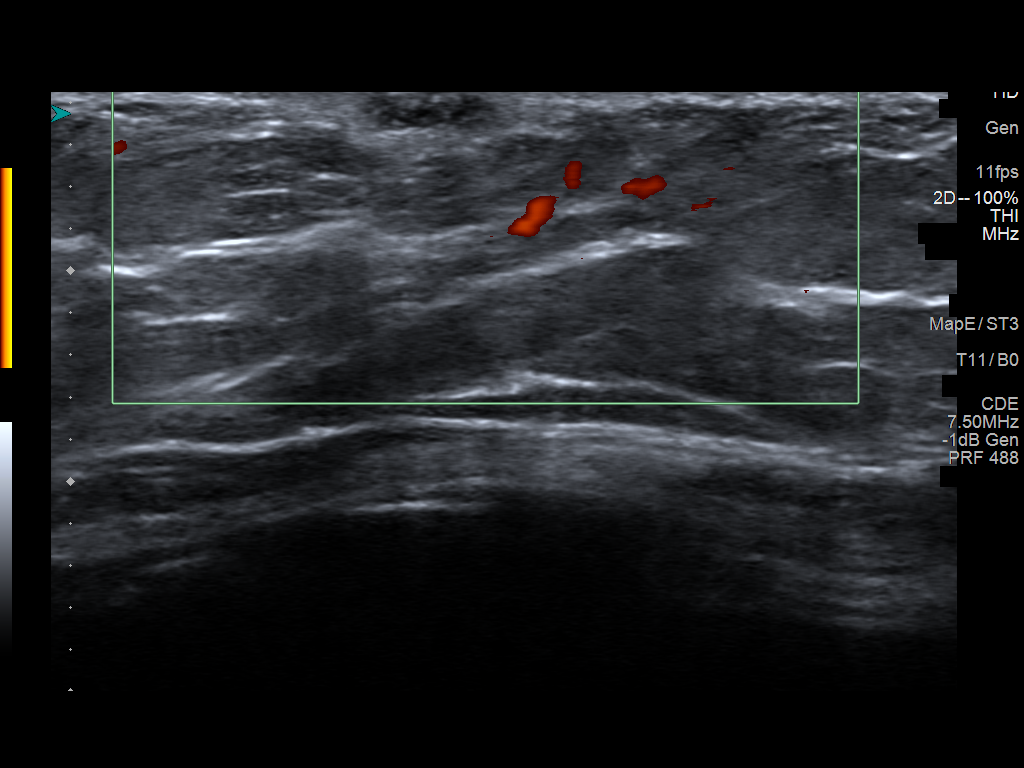

[6 of 6 positions shown; findings below may reference images not displayed]

ACR Breast Density Category b: There are scattered areas of
fibroglandular density.
FINDINGS: Metallic skin marker was placed in the lower inner quadrant of the
left breast in the region of a superficial palpable lump. No
suspicious mass, distortion, or suspicious microcalcification is
identified within the breast. A spot tangential view of the region
of palpable concern shows a tiny density likely associated with the
skin.

Mammographic images were processed with CAD.

On physical exam, I do palpate a superficial lump likely within the
dermis.

Targeted ultrasound is performed, showing a mildly complex oval cyst
within the dermis of the left breast at 8 o'clock position 9 cm from
the nipple. This sebaceous cyst measures 7 x 2 x 5 mm. The
subtending breast parenchyma is normal. There is no vascular flow
within the sebaceous cyst.
IMPRESSION: Benign sebaceous cyst in the 8 o'clock position of the left breast
accounts for the patient's area of palpable concern. No evidence of
malignancy in the left breast.

RECOMMENDATION:
Bilateral screening mammogram is recommended in September 2015.

I have discussed the findings and recommendations with the patient.
Results were also provided in writing at the conclusion of the
visit. If applicable, a reminder letter will be sent to the patient
regarding the next appointment.

BI-RADS CATEGORY  2: Benign.

## 2017-01-23 DIAGNOSIS — L309 Dermatitis, unspecified: Secondary | ICD-10-CM | POA: Diagnosis not present

## 2017-01-23 DIAGNOSIS — D229 Melanocytic nevi, unspecified: Secondary | ICD-10-CM | POA: Diagnosis not present

## 2017-05-09 DIAGNOSIS — Z1159 Encounter for screening for other viral diseases: Secondary | ICD-10-CM | POA: Diagnosis not present

## 2017-05-09 DIAGNOSIS — E039 Hypothyroidism, unspecified: Secondary | ICD-10-CM | POA: Diagnosis not present

## 2017-05-09 DIAGNOSIS — Z23 Encounter for immunization: Secondary | ICD-10-CM | POA: Diagnosis not present

## 2017-05-09 DIAGNOSIS — E785 Hyperlipidemia, unspecified: Secondary | ICD-10-CM | POA: Diagnosis not present

## 2017-05-09 DIAGNOSIS — I10 Essential (primary) hypertension: Secondary | ICD-10-CM | POA: Diagnosis not present

## 2017-05-31 DIAGNOSIS — L080 Pyoderma: Secondary | ICD-10-CM | POA: Diagnosis not present

## 2017-05-31 DIAGNOSIS — L309 Dermatitis, unspecified: Secondary | ICD-10-CM | POA: Diagnosis not present

## 2017-07-03 DIAGNOSIS — D485 Neoplasm of uncertain behavior of skin: Secondary | ICD-10-CM | POA: Diagnosis not present

## 2017-07-03 DIAGNOSIS — L0889 Other specified local infections of the skin and subcutaneous tissue: Secondary | ICD-10-CM | POA: Diagnosis not present

## 2017-07-10 DIAGNOSIS — Z4802 Encounter for removal of sutures: Secondary | ICD-10-CM | POA: Diagnosis not present

## 2017-07-11 DIAGNOSIS — Z23 Encounter for immunization: Secondary | ICD-10-CM | POA: Diagnosis not present

## 2017-08-30 DIAGNOSIS — Z01419 Encounter for gynecological examination (general) (routine) without abnormal findings: Secondary | ICD-10-CM | POA: Diagnosis not present

## 2017-08-30 DIAGNOSIS — Z6828 Body mass index (BMI) 28.0-28.9, adult: Secondary | ICD-10-CM | POA: Diagnosis not present

## 2017-09-04 DIAGNOSIS — L928 Other granulomatous disorders of the skin and subcutaneous tissue: Secondary | ICD-10-CM | POA: Diagnosis not present

## 2017-09-04 DIAGNOSIS — L080 Pyoderma: Secondary | ICD-10-CM | POA: Diagnosis not present

## 2017-09-04 DIAGNOSIS — L923 Foreign body granuloma of the skin and subcutaneous tissue: Secondary | ICD-10-CM | POA: Diagnosis not present

## 2017-09-17 DIAGNOSIS — Z4802 Encounter for removal of sutures: Secondary | ICD-10-CM | POA: Diagnosis not present

## 2017-09-27 DIAGNOSIS — Z1231 Encounter for screening mammogram for malignant neoplasm of breast: Secondary | ICD-10-CM | POA: Diagnosis not present

## 2017-10-09 DIAGNOSIS — H40013 Open angle with borderline findings, low risk, bilateral: Secondary | ICD-10-CM | POA: Diagnosis not present

## 2017-11-01 DIAGNOSIS — Z23 Encounter for immunization: Secondary | ICD-10-CM | POA: Diagnosis not present

## 2017-12-01 DIAGNOSIS — N3000 Acute cystitis without hematuria: Secondary | ICD-10-CM | POA: Diagnosis not present

## 2017-12-01 DIAGNOSIS — I10 Essential (primary) hypertension: Secondary | ICD-10-CM | POA: Diagnosis not present

## 2019-05-29 ENCOUNTER — Ambulatory Visit (INDEPENDENT_AMBULATORY_CARE_PROVIDER_SITE_OTHER): Payer: Medicare Other | Admitting: Physician Assistant

## 2019-05-29 ENCOUNTER — Encounter: Payer: Self-pay | Admitting: Physician Assistant

## 2019-05-29 ENCOUNTER — Other Ambulatory Visit: Payer: Self-pay

## 2019-05-29 DIAGNOSIS — L2089 Other atopic dermatitis: Secondary | ICD-10-CM

## 2019-05-29 DIAGNOSIS — L720 Epidermal cyst: Secondary | ICD-10-CM

## 2019-05-29 NOTE — Progress Notes (Signed)
   Follow-Up Visit   Subjective  Sayler Vedder is a 69 y.o. female who presents for the following: Cyst (Here for possibly recurrent cyst on her scalp.  States that she had one removed in almost the same spot.). No symptoms.  History of AD and has a history of allergies. No whelps. Marked improvement with cessation of estrogen patch.  The following portions of the chart were reviewed this encounter and updated as appropriate: Tobacco  Allergies  Meds  Problems  Med Hx  Surg Hx  Fam Hx      Objective  Well appearing patient in no apparent distress; mood and affect are within normal limits.  A focused examination was performed including face, neck, chest and back. Relevant physical exam findings are noted in the Assessment and Plan.  Objective  L posterior scalp: Hard knot- skin toned   Assessment & Plan  Epidermal cyst L posterior scalp  Schedule 30 min surgery

## 2019-08-05 ENCOUNTER — Encounter: Payer: Medicare Other | Admitting: Physician Assistant

## 2019-08-26 ENCOUNTER — Encounter: Payer: Medicare Other | Admitting: Physician Assistant

## 2019-08-28 ENCOUNTER — Ambulatory Visit (INDEPENDENT_AMBULATORY_CARE_PROVIDER_SITE_OTHER): Payer: Medicare Other | Admitting: Physician Assistant

## 2019-08-28 ENCOUNTER — Encounter: Payer: Self-pay | Admitting: Physician Assistant

## 2019-08-28 ENCOUNTER — Other Ambulatory Visit: Payer: Self-pay

## 2019-08-28 DIAGNOSIS — L7211 Pilar cyst: Secondary | ICD-10-CM

## 2019-08-28 NOTE — Progress Notes (Addendum)
   Follow-Up Visit   Subjective  Brooke Yoder is a 69 y.o. female who presents for the following: Procedure (here for cyst removal on scalp).   The following portions of the chart were reviewed this encounter and updated as appropriate: Tobacco  Allergies  Meds  Problems  Med Hx  Surg Hx  Fam Hx      Objective  Well appearing patient in no apparent distress; mood and affect are within normal limits.  A focused examination was performed including scalp. Relevant physical exam findings are noted in the Assessment and Plan.  Objective  Skin toned nodule  Left Occipital Scalp: Raised nodule 4-0 prolene x 3 2.1cm  Images    Assessment & Plan  Pilar cyst Left Occipital Scalp  Skin excision - Left Occipital Scalp  Lesion length (cm):  1.5 Lesion width (cm):  1.5 Margin per side (cm):  0.1 Total excision diameter (cm):  1.7 Informed consent: discussed and consent obtained   Timeout: patient name, date of birth, surgical site, and procedure verified   Anesthesia: the lesion was anesthetized in a standard fashion   Anesthetic:  1% lidocaine w/ epinephrine 1-100,000 local infiltration Instrument used: #15 blade   Hemostasis achieved with: pressure and electrodesiccation   Outcome: patient tolerated procedure well with no complications   Post-procedure details: sterile dressing applied and wound care instructions given   Dressing type: bandage, petrolatum and pressure dressing    Skin repair - Left Occipital Scalp Complexity:  Simple Final length (cm):  1.7 Informed consent: discussed and consent obtained   Timeout: patient name, date of birth, surgical site, and procedure verified   Procedure prep:  Patient was prepped and draped in usual sterile fashion (non sterile) Prep type:  Chlorhexidine Anesthesia: the lesion was anesthetized in a standard fashion   Undermining: edges undermined   Fine/surface layer approximation (top stitches):  Suture size:   4-0 Suture type: Vicryl Rapide (coated polyglactin 910) and nylon   Suture type comment:  Nylon Stitches: simple running   Suture removal (days):  7 Hemostasis achieved with: suture, pressure and electrodesiccation Outcome: patient tolerated procedure well with no complications   Post-procedure details: wound care instructions given   Post-procedure details comment:  Non sterile pressure   Specimen 1 - Surgical pathology Differential Diagnosis: pilar cyst Check Margins: No   I, Mirielle Byrum, PA-C, have reviewed all documentation's for this visit.  The documentation on 10/29/19 for the exam, diagnosis, procedures and orders are all accurate and complete.

## 2019-08-28 NOTE — Patient Instructions (Signed)

## 2019-09-08 ENCOUNTER — Other Ambulatory Visit: Payer: Self-pay

## 2019-09-08 ENCOUNTER — Ambulatory Visit: Payer: Medicare Other

## 2019-09-08 DIAGNOSIS — L720 Epidermal cyst: Secondary | ICD-10-CM

## 2023-01-22 ENCOUNTER — Other Ambulatory Visit: Payer: Self-pay | Admitting: Neurosurgery

## 2023-01-22 DIAGNOSIS — M5416 Radiculopathy, lumbar region: Secondary | ICD-10-CM

## 2023-02-09 ENCOUNTER — Ambulatory Visit
Admission: RE | Admit: 2023-02-09 | Discharge: 2023-02-09 | Disposition: A | Discharge status: Home / Self Care | Payer: Medicare Other | Source: Ambulatory Visit | Attending: Neurosurgery | Admitting: Neurosurgery

## 2023-02-09 DIAGNOSIS — M5416 Radiculopathy, lumbar region: Secondary | ICD-10-CM

## 2023-02-15 ENCOUNTER — Other Ambulatory Visit: Payer: Medicare Other

## 2023-08-24 ENCOUNTER — Encounter: Payer: Self-pay | Admitting: Advanced Practice Midwife
